# Patient Record
Sex: Female | Born: 2000 | Race: White | Hispanic: No | Marital: Single
Health system: Southern US, Community
[De-identification: ages and names within clinical notes are randomized; demographics above are authoritative.]

---
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
## 2023-04-21 LAB — PANORAMA PRENATAL TEST FULL PANEL:PANORAMA TEST PLUS 5 ADDITIONAL MICRODELETIONS: FETAL FRACTION: 2.5
